# Patient Record
Sex: Female | Born: 1990 | Race: White | Hispanic: No | Marital: Married | Smoking: Never smoker
Health system: Southern US, Community
[De-identification: ages and names within clinical notes are randomized; demographics above are authoritative.]

## PROBLEM LIST (undated history)

## (undated) DIAGNOSIS — E282 Polycystic ovarian syndrome: Secondary | ICD-10-CM

## (undated) DIAGNOSIS — T8859XA Other complications of anesthesia, initial encounter: Secondary | ICD-10-CM

## (undated) DIAGNOSIS — T4145XA Adverse effect of unspecified anesthetic, initial encounter: Secondary | ICD-10-CM

## (undated) HISTORY — PX: SALPINGECTOMY: SHX328

## (undated) HISTORY — PX: ECTOPIC PREGNANCY SURGERY: SHX613

---
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
## 2017-12-11 LAB — OB RESULTS CONSOLE ANTIBODY SCREEN: Antibody Screen: NEGATIVE

## 2017-12-11 LAB — OB RESULTS CONSOLE GC/CHLAMYDIA
Chlamydia: NEGATIVE
Gonorrhea: NEGATIVE

## 2017-12-11 LAB — OB RESULTS CONSOLE HEPATITIS B SURFACE ANTIGEN: Hepatitis B Surface Ag: NEGATIVE

## 2017-12-11 LAB — OB RESULTS CONSOLE HIV ANTIBODY (ROUTINE TESTING): HIV: NONREACTIVE

## 2017-12-11 LAB — OB RESULTS CONSOLE ABO/RH: RH Type: POSITIVE

## 2017-12-11 LAB — OB RESULTS CONSOLE RPR: RPR: NONREACTIVE

## 2017-12-11 LAB — OB RESULTS CONSOLE RUBELLA ANTIBODY, IGM: Rubella: IMMUNE

## 2018-06-02 ENCOUNTER — Inpatient Hospital Stay (HOSPITAL_COMMUNITY)
Admission: AD | Admit: 2018-06-02 | Discharge: 2018-06-02 | Disposition: A | Discharge status: Home / Self Care | Payer: Medicaid Other | Source: Ambulatory Visit | Attending: Obstetrics and Gynecology | Admitting: Obstetrics and Gynecology

## 2018-06-02 ENCOUNTER — Other Ambulatory Visit: Payer: Self-pay

## 2018-06-02 ENCOUNTER — Encounter (HOSPITAL_COMMUNITY): Payer: Self-pay

## 2018-06-02 DIAGNOSIS — O98812 Other maternal infectious and parasitic diseases complicating pregnancy, second trimester: Secondary | ICD-10-CM

## 2018-06-02 DIAGNOSIS — Z3A34 34 weeks gestation of pregnancy: Secondary | ICD-10-CM | POA: Diagnosis not present

## 2018-06-02 DIAGNOSIS — B9689 Other specified bacterial agents as the cause of diseases classified elsewhere: Secondary | ICD-10-CM | POA: Diagnosis not present

## 2018-06-02 DIAGNOSIS — O23593 Infection of other part of genital tract in pregnancy, third trimester: Secondary | ICD-10-CM | POA: Insufficient documentation

## 2018-06-02 DIAGNOSIS — N760 Acute vaginitis: Secondary | ICD-10-CM | POA: Diagnosis not present

## 2018-06-02 DIAGNOSIS — Z880 Allergy status to penicillin: Secondary | ICD-10-CM | POA: Diagnosis not present

## 2018-06-02 DIAGNOSIS — O26893 Other specified pregnancy related conditions, third trimester: Secondary | ICD-10-CM | POA: Diagnosis present

## 2018-06-02 DIAGNOSIS — N898 Other specified noninflammatory disorders of vagina: Secondary | ICD-10-CM | POA: Diagnosis present

## 2018-06-02 LAB — URINALYSIS, ROUTINE W REFLEX MICROSCOPIC
Bilirubin Urine: NEGATIVE
Glucose, UA: NEGATIVE mg/dL
Ketones, ur: NEGATIVE mg/dL
Leukocytes,Ua: NEGATIVE
Nitrite: NEGATIVE
Protein, ur: NEGATIVE mg/dL
Specific Gravity, Urine: 1.015 (ref 1.005–1.030)
pH: 6 (ref 5.0–8.0)

## 2018-06-02 LAB — WET PREP, GENITAL
Sperm: NONE SEEN
Trich, Wet Prep: NONE SEEN
Yeast Wet Prep HPF POC: NONE SEEN

## 2018-06-02 MED ORDER — METRONIDAZOLE 0.75 % VA GEL: 1.0000 | Freq: Every day | VAGINAL | 0 refills | Status: AC

## 2018-06-02 NOTE — MAU Provider Note (Signed)
History     CSN: [id number]  Arrival date and time: 06/02/18 1239   First Provider Initiated Contact with Patient 06/02/18 1339      Chief Complaint  Patient presents with  . Pelvic Pain  . Rupture of Membranes   HPI  Ms.  Diamond Cunningham is a 28 y.o. year old G26P2052 female at [redacted]w[redacted]d weeks gestation who presents to MAU reporting "leaking a clear fluid since 2100 last night (5/16)" and lower pelvic pain (rated 4/100; which is not a new issue. She denies recent SI. She describes the leaking as not trickling, but an increased amount that causes her underwear to be wet. She expresses concern that the increased discharge means something could be wrong. She states she is not supposed to labor, because she is having a RCS. She called her OB office and was instructed to come to MAU for evaluation. She receives her prenatal care at [facility]  History reviewed. No pertinent past medical history.  Past Surgical History:  Procedure Laterality Date  . ECTOPIC PREGNANCY SURGERY     states the pregnancy was partially in uterus and in tube so she had an incision on uterus, with wedge resection  . SALPINGECTOMY Left     History reviewed. No pertinent family history.  Social History   Tobacco Use  . Smoking status: Never Smoker  . Smokeless tobacco: Never Used  Substance Use Topics  . Alcohol use: Not Currently  . Drug use: Never    Allergies:  Allergies  Allergen Reactions  . Shellfish Allergy Shortness Of Breath and Nausea Only  . Penicillins Rash    No medications prior to admission.    Review of Systems  Constitutional: Negative.   HENT: Negative.   Eyes: Negative.   Respiratory: Negative.   Cardiovascular: Negative.   Gastrointestinal: Negative.   Endocrine: Negative.   Genitourinary: Positive for pelvic pain ("not a new problem, has prevented SI for a while") and vaginal discharge (clear fluid that wets underwear since last night at about 2100).   Musculoskeletal: Negative.   Skin: Negative.   Allergic/Immunologic: Negative.   Neurological: Negative.   Hematological: Negative.   Psychiatric/Behavioral: Negative.    Physical Exam   Blood pressure (!) 130/56, pulse (!) 113, resp. rate 20, height 5\' 4"  (1.626 m), weight 93.5 kg, SpO2 97 %.  Physical Exam  Nursing note and vitals reviewed. Constitutional: She is oriented to person, place, and time. She appears well-developed and well-nourished.  HENT:  Head: Normocephalic and atraumatic.  Eyes: Pupils are equal, round, and reactive to light.  Neck: Normal range of motion.  Cardiovascular: Normal rate, regular rhythm, normal heart sounds and intact distal pulses.  Respiratory: Effort normal and breath sounds normal.  GI: Soft. Bowel sounds are normal.  Genitourinary:    Genitourinary Comments: Uterus: gravid, S=D, SE: cervix is smooth, pink, no lesions, moderate amt of thin, white vaginal d/c -- WP, GC/CT done, closed/long/soft, no CMT or friability, no adnexal tenderness    Musculoskeletal: Normal range of motion.  Neurological: She is alert and oriented to person, place, and time. She has normal reflexes.  Skin: Skin is warm and dry.  Psychiatric: She has a normal mood and affect. Her behavior is normal. Judgment and thought content normal.   NST - FHR: 140 bpm / moderate variability / accels present / decels absent / TOCO: Occ. UI noted  MAU Course  Procedures  MDM CCUA Wet Prep GC/CT -- pending Fern Test NST  Results for  orders placed or performed during the hospital encounter of 06/02/18 (from the past 24 hour(s))  Urinalysis, Routine w reflex microscopic     Status: Abnormal   Collection Time: 06/02/18  1:57 PM  Result Value Ref Range   Color, Urine YELLOW YELLOW   APPearance CLEAR CLEAR   Specific Gravity, Urine 1.015 1.005 - 1.030   pH 6.0 5.0 - 8.0   Glucose, UA NEGATIVE NEGATIVE mg/dL   Hgb urine dipstick SMALL (A) NEGATIVE   Bilirubin Urine NEGATIVE  NEGATIVE   Ketones, ur NEGATIVE NEGATIVE mg/dL   Protein, ur NEGATIVE NEGATIVE mg/dL   Nitrite NEGATIVE NEGATIVE   Leukocytes,Ua NEGATIVE NEGATIVE   RBC / HPF 11-20 0 - 5 RBC/hpf   WBC, UA 0-5 0 - 5 WBC/hpf   Bacteria, UA RARE (A) NONE SEEN   Squamous Epithelial / LPF 0-5 0 - 5   Mucus PRESENT   Wet prep, genital     Status: Abnormal   Collection Time: 06/02/18  1:57 PM  Result Value Ref Range   Yeast Wet Prep HPF POC NONE SEEN NONE SEEN   Trich, Wet Prep NONE SEEN NONE SEEN   Clue Cells Wet Prep HPF POC PRESENT (A) NONE SEEN   WBC, Wet Prep HPF POC MODERATE (A) NONE SEEN   Sperm NONE SEEN      Assessment and Plan  Bacterial vaginosis - Plan: Discharge patient - Information provided on BV and Metrogel - Keep scheduled appt with [facility] - Patient verbalized an understanding of the plan of care and agrees.    Allergies as of 06/02/2018      Reactions   Shellfish Allergy Shortness Of Breath, Nausea Only   Penicillins Rash      Medication List    TAKE these medications   metroNIDAZOLE 0.75 % vaginal gel Commonly known as:  METROGEL VAGINAL Place 1 Applicatorful vaginally at bedtime for 5 days.       Raelyn Moraolitta Jaspreet Hollings, MSN, CNM 06/02/2018, 1:39 PM

## 2018-06-02 NOTE — MAU Note (Signed)
Pt reports leaking some clear fluid since 9pm last night.  Pt denies any recent intercourse.  Pt also c/o pelvic pain that she rates 4/10.  Pt reports good fetal movement.

## 2018-06-03 LAB — GC/CHLAMYDIA PROBE AMP (~~LOC~~) NOT AT ARMC
Chlamydia: NEGATIVE
Neisseria Gonorrhea: NEGATIVE

## 2018-06-14 ENCOUNTER — Encounter (HOSPITAL_COMMUNITY): Payer: Self-pay | Admitting: *Deleted

## 2018-06-14 NOTE — Patient Instructions (Addendum)
Asheli Matyas  06/14/2018   Your procedure is scheduled on:  6.15.2020  Arrive at 0730 at Entrance C on [address] at [facility]  and [employer]. You are invited to use the FREE valet parking or use the Visitor's parking deck.  Pick up the phone at the desk and dial [phone].  Call this number if you have problems the morning of surgery: [phone]  Remember:   Do not eat food:(After Midnight) Desps de medianoche.  Do not drink clear liquids: (After Midnight) Desps de medianoche.  Take these medicines the morning of surgery with A SIP OF WATER:  No metformin the night before surgery   Do not wear jewelry, make-up or nail polish.  Do not wear lotions, powders, or perfumes. Do not wear deodorant.  Do not shave 48 hours prior to surgery.  Do not bring valuables to the hospital.  [facility] is not   responsible for any belongings or valuables brought to the hospital.  Contacts, dentures or bridgework may not be worn into surgery.  Leave suitcase in the car. After surgery it may be brought to your room.  For patients admitted to the hospital, checkout time is 11:00 AM the day of              discharge.      Please read over the following fact sheets that you were given:     Preparing for Surgery

## 2018-06-18 ENCOUNTER — Encounter (HOSPITAL_COMMUNITY): Payer: Self-pay | Admitting: *Deleted

## 2018-06-26 NOTE — H&P (Signed)
Diamond Cunningham is a 73 y.A.[id number] female presenting for primary cesarean section with tubal ligation due to history of cornual ectopic in 06/2017. Pt is dated per LMP which was confirmed with a 5week [place]. Pregnancy has been complicated recently with preterm contractions but no labor. 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 She is GBS negative. She declined genetic screening OB History    Gravida  8   Para  2   Term  2   Preterm      AB  5   Living  2     SAB  4   TAB      Ectopic  1   Multiple      Live Births             Past Medical History:  Diagnosis Date  . Complication of anesthesia    dental work anesthesia doesn't work  . PCOS (polycystic ovarian syndrome)    Past Surgical History:  Procedure Laterality Date  . ECTOPIC PREGNANCY SURGERY     states the pregnancy was partially in uterus and in tube so she had an incision on uterus, with wedge resection  . SALPINGECTOMY Left    Family History: family history includes Heart disease in her paternal grandfather; Hypertension in her father. Social History:  reports that she has never smoked. She has never used smokeless tobacco. She reports previous alcohol use. She reports that she does not use drugs.     Maternal Diabetes: No Genetic Screening: Declined Maternal Ultrasounds/Referrals: Normal Fetal Ultrasounds or other Referrals:  None Maternal Substance Abuse:  No Significant Maternal Medications:  Meds include: Other: metformin Significant Maternal Lab Results:  Lab values include: Group B Strep negative Other Comments:  None  Review of Systems  Constitutional: Negative for chills, diaphoresis, fever, malaise/fatigue and weight loss.  Eyes: Negative for blurred vision and double vision.  Respiratory: Negative for cough and shortness of breath.   Cardiovascular: Positive for leg  swelling. Negative for chest pain and palpitations.  Gastrointestinal: Positive for abdominal pain. Negative for heartburn, nausea and vomiting.  Genitourinary: Negative for dysuria and urgency.  Musculoskeletal: Negative for myalgias.  Skin: Negative for itching and rash.  Neurological: Negative for dizziness and headaches.  Endo/Heme/Allergies: Negative for environmental allergies. Does not bruise/bleed easily.  Psychiatric/Behavioral: Negative for depression, hallucinations, substance abuse and suicidal ideas. The patient is nervous/anxious.    Maternal Medical History:  Reason for admission: Nausea. Scheduled cesarean section and tubal ligation  Contractions: Onset was more than 2 days ago.   Frequency: irregular.   Perceived severity is mild.    Fetal activity: Perceived fetal activity is normal.   Last perceived fetal movement was within the past hour.    Prenatal complications: no prenatal complications Prenatal Complications - Diabetes: none.      Height 5\' 4"  (1.626 m), weight 93 kg. Maternal Exam:  Uterine Assessment: Contraction strength is mild.  Contraction frequency is irregular.   Abdomen: Patient reports generalized tenderness.  Estimated fetal weight is AGA.   Fetal presentation: vertex  Introitus: Vulva is negative for lesion.  Pelvis: of concern for delivery.      Physical Exam  Constitutional: She is oriented to person, place, and time. She appears well-developed and well-nourished.  Neck: Normal range of motion.  Respiratory: Effort normal.  GI: Soft. There is generalized abdominal tenderness.  Genitourinary:  No vulval lesion noted.   Musculoskeletal: Normal range of motion.  Neurological: She is alert and oriented to person, place, and time.  Skin: Skin is warm.  Psychiatric: She has a normal mood and affect. Her behavior is normal. Judgment and thought content normal.    Prenatal labs: ABO, Rh: B/Positive/-- (11/26 0000) Antibody:  Negative (11/26 0000) Rubella: Immune (11/26 0000) RPR: Nonreactive (11/26 0000)  HBsAg: Negative (11/26 0000)  HIV: Non-reactive (11/26 0000)  GBS:     Assessment/Plan: 27y G8 [id number] female presenting at 5838 2/7weeks for primary cesarean section with tubal ligation due to history of cornual ectopic  Risks and benefits of procedure have been reviewed GBS neg  Pt to OR when ready SCDs and antibx ordered ERAS protocol Covid testing done  Diamond Cunningham 06/26/2018, 3:35 PM

## 2018-06-27 ENCOUNTER — Other Ambulatory Visit: Payer: Self-pay

## 2018-06-27 ENCOUNTER — Ambulatory Visit (HOSPITAL_COMMUNITY)
Admission: RE | Admit: 2018-06-27 | Discharge: 2018-06-27 | Disposition: A | Discharge status: Home / Self Care | Payer: Medicaid Other | Source: Ambulatory Visit | Attending: Obstetrics and Gynecology | Admitting: Obstetrics and Gynecology

## 2018-06-27 DIAGNOSIS — Z1159 Encounter for screening for other viral diseases: Secondary | ICD-10-CM | POA: Diagnosis not present

## 2018-06-27 NOTE — MAU Note (Signed)
Asymptomatic, unable to pass on left, no problems on rt

## 2018-06-28 LAB — NOVEL CORONAVIRUS, NAA (HOSP ORDER, SEND-OUT TO REF LAB; TAT 18-24 HRS): SARS-CoV-2, NAA: NOT DETECTED

## 2018-06-30 NOTE — Anesthesia Preprocedure Evaluation (Addendum)
Anesthesia Evaluation  Patient identified by MRN, date of birth, ID band Patient awake  General Assessment Comment:Anesthesia in dental office did not work  Reviewed: Allergy & Precautions, NPO status , Patient's Chart, lab work & pertinent test results  Airway Mallampati: II  TM Distance: >3 FB Neck ROM: Full    Dental no notable dental hx. (+) Teeth Intact   Pulmonary neg pulmonary ROS,    Pulmonary exam normal breath sounds clear to auscultation       Cardiovascular Normal cardiovascular exam Rhythm:Regular Rate:Normal     Neuro/Psych    GI/Hepatic Neg liver ROS,   Endo/Other  negative endocrine ROS  Renal/GU      Musculoskeletal   Abdominal   Peds  Hematology   Anesthesia Other Findings   Reproductive/Obstetrics (+) Pregnancy                            Lab Results  Component Value Date   WBC 9.2 07/01/2018   HGB 10.6 (L) 07/01/2018   HCT 30.9 (L) 07/01/2018   MCV 91.2 07/01/2018   PLT 206 07/01/2018    Anesthesia Physical Anesthesia Plan  ASA: III  Anesthesia Plan: Spinal   Post-op Pain Management:    Induction:   PONV Risk Score and Plan: 3 and Treatment may vary due to age or medical condition, Dexamethasone and Ondansetron  Airway Management Planned: Natural Airway and Simple Face Mask  Additional Equipment:   Intra-op Plan:   Post-operative Plan:   Informed Consent: I have reviewed the patients History and Physical, chart, labs and discussed the procedure including the risks, benefits and alternatives for the proposed anesthesia with the patient or authorized representative who has indicated his/her understanding and acceptance.     Dental advisory given  Plan Discussed with:   Anesthesia Plan Comments: (Primary c/s w PPTL under spinal pend labs, T&S, )       Anesthesia Quick Evaluation

## 2018-07-01 ENCOUNTER — Encounter (HOSPITAL_COMMUNITY): Payer: Self-pay | Admitting: *Deleted

## 2018-07-01 ENCOUNTER — Inpatient Hospital Stay (HOSPITAL_COMMUNITY): Payer: Medicaid Other | Admitting: Anesthesiology

## 2018-07-01 ENCOUNTER — Encounter (HOSPITAL_COMMUNITY)
Admission: AD | Disposition: A | Discharge status: Home / Self Care | Payer: Self-pay | Source: Home / Self Care | Attending: Obstetrics and Gynecology

## 2018-07-01 ENCOUNTER — Inpatient Hospital Stay (HOSPITAL_COMMUNITY)
Admission: AD | Admit: 2018-07-01 | Discharge: 2018-07-03 | DRG: 785 | Disposition: A | Discharge status: Home / Self Care | Payer: Medicaid Other | Attending: Obstetrics and Gynecology | Admitting: Obstetrics and Gynecology

## 2018-07-01 ENCOUNTER — Other Ambulatory Visit: Payer: Self-pay

## 2018-07-01 DIAGNOSIS — Z3A38 38 weeks gestation of pregnancy: Secondary | ICD-10-CM

## 2018-07-01 DIAGNOSIS — O99284 Endocrine, nutritional and metabolic diseases complicating childbirth: Principal | ICD-10-CM | POA: Diagnosis present

## 2018-07-01 DIAGNOSIS — Z9851 Tubal ligation status: Secondary | ICD-10-CM

## 2018-07-01 DIAGNOSIS — Z98891 History of uterine scar from previous surgery: Secondary | ICD-10-CM

## 2018-07-01 DIAGNOSIS — Z3490 Encounter for supervision of normal pregnancy, unspecified, unspecified trimester: Secondary | ICD-10-CM

## 2018-07-01 DIAGNOSIS — Z302 Encounter for sterilization: Secondary | ICD-10-CM | POA: Diagnosis not present

## 2018-07-01 DIAGNOSIS — O26893 Other specified pregnancy related conditions, third trimester: Secondary | ICD-10-CM | POA: Diagnosis present

## 2018-07-01 DIAGNOSIS — E282 Polycystic ovarian syndrome: Secondary | ICD-10-CM | POA: Diagnosis present

## 2018-07-01 HISTORY — DX: Polycystic ovarian syndrome: E28.2

## 2018-07-01 HISTORY — DX: Other complications of anesthesia, initial encounter: T88.59XA

## 2018-07-01 HISTORY — DX: Tubal ligation status: Z98.51

## 2018-07-01 LAB — TYPE AND SCREEN
ABO/RH(D): B POS
Antibody Screen: NEGATIVE

## 2018-07-01 LAB — CBC
HCT: 30.9 % — ABNORMAL LOW (ref 36.0–46.0)
Hemoglobin: 10.6 g/dL — ABNORMAL LOW (ref 12.0–15.0)
MCH: 31.3 pg (ref 26.0–34.0)
MCHC: 34.3 g/dL (ref 30.0–36.0)
MCV: 91.2 fL (ref 80.0–100.0)
Platelets: 206 10*3/uL (ref 150–400)
RBC: 3.39 MIL/uL — ABNORMAL LOW (ref 3.87–5.11)
RDW: 14.5 % (ref 11.5–15.5)
WBC: 9.2 10*3/uL (ref 4.0–10.5)
nRBC: 0 % (ref 0.0–0.2)

## 2018-07-01 LAB — ABO/RH
ABO/RH(D): B POS
Weak D: POSITIVE

## 2018-07-01 SURGERY — Surgical Case
Anesthesia: Spinal

## 2018-07-01 MED ORDER — NALOXONE HCL 0.4 MG/ML IJ SOLN: 0.4000 mg | INTRAMUSCULAR | Status: DC | PRN

## 2018-07-01 MED ORDER — DEXAMETHASONE SODIUM PHOSPHATE 4 MG/ML IJ SOLN
INTRAMUSCULAR | Status: DC | PRN
  Administered 2018-07-01: 4 mg via INTRAVENOUS

## 2018-07-01 MED ORDER — BUPIVACAINE IN DEXTROSE 0.75-8.25 % IT SOLN
INTRATHECAL | Status: DC | PRN
  Administered 2018-07-01: 12 mg via INTRATHECAL

## 2018-07-01 MED ORDER — HYDROMORPHONE HCL 1 MG/ML IJ SOLN: 0.2500 mg | INTRAMUSCULAR | Status: DC | PRN

## 2018-07-01 MED ORDER — LACTATED RINGERS IV SOLN
INTRAVENOUS | Status: DC
  Administered 2018-07-01 (×2): via INTRAVENOUS

## 2018-07-01 MED ORDER — GENTAMICIN SULFATE 40 MG/ML IJ SOLN
5.0000 mg/kg | INTRAVENOUS | Status: AC
  Administered 2018-07-01: 465.2 mg via INTRAVENOUS
  Filled 2018-07-01: qty 11.75

## 2018-07-01 MED ORDER — CLINDAMYCIN PHOSPHATE 900 MG/50ML IV SOLN
INTRAVENOUS | Status: AC
  Filled 2018-07-01: qty 50

## 2018-07-01 MED ORDER — ONDANSETRON HCL 4 MG/2ML IJ SOLN: 4.0000 mg | Freq: Once | INTRAMUSCULAR | Status: DC | PRN

## 2018-07-01 MED ORDER — NALBUPHINE HCL 10 MG/ML IJ SOLN: 5.0000 mg | INTRAMUSCULAR | Status: DC | PRN

## 2018-07-01 MED ORDER — DIPHENHYDRAMINE HCL 25 MG PO CAPS: 25.0000 mg | ORAL_CAPSULE | ORAL | Status: DC | PRN

## 2018-07-01 MED ORDER — DEXMEDETOMIDINE HCL IN NACL 200 MCG/50ML IV SOLN
INTRAVENOUS | Status: AC
  Filled 2018-07-01: qty 50

## 2018-07-01 MED ORDER — IBUPROFEN 800 MG PO TABS
800.0000 mg | ORAL_TABLET | Freq: Three times a day (TID) | ORAL | Status: DC
  Administered 2018-07-01 – 2018-07-03 (×6): 800 mg via ORAL
  Filled 2018-07-01 (×7): qty 1

## 2018-07-01 MED ORDER — MEPERIDINE HCL 25 MG/ML IJ SOLN: 6.2500 mg | INTRAMUSCULAR | Status: DC | PRN

## 2018-07-01 MED ORDER — KETOROLAC TROMETHAMINE 30 MG/ML IJ SOLN
30.0000 mg | Freq: Four times a day (QID) | INTRAMUSCULAR | Status: AC | PRN
  Administered 2018-07-01: 30 mg via INTRAMUSCULAR

## 2018-07-01 MED ORDER — WITCH HAZEL-GLYCERIN EX PADS: 1.0000 "application " | MEDICATED_PAD | CUTANEOUS | Status: DC | PRN

## 2018-07-01 MED ORDER — SIMETHICONE 80 MG PO CHEW
80.0000 mg | CHEWABLE_TABLET | ORAL | Status: DC
  Administered 2018-07-01 – 2018-07-02 (×2): 80 mg via ORAL
  Filled 2018-07-01 (×2): qty 1

## 2018-07-01 MED ORDER — SODIUM CHLORIDE 0.9 % IV SOLN
INTRAVENOUS | Status: DC | PRN
  Administered 2018-07-01: 10:00:00 via INTRAVENOUS

## 2018-07-01 MED ORDER — CLINDAMYCIN PHOSPHATE 900 MG/50ML IV SOLN
900.0000 mg | INTRAVENOUS | Status: AC
  Administered 2018-07-01: 900 mg via INTRAVENOUS

## 2018-07-01 MED ORDER — FENTANYL CITRATE (PF) 100 MCG/2ML IJ SOLN
INTRAMUSCULAR | Status: AC
  Filled 2018-07-01: qty 2

## 2018-07-01 MED ORDER — FENTANYL CITRATE (PF) 100 MCG/2ML IJ SOLN
INTRAMUSCULAR | Status: DC | PRN
  Administered 2018-07-01: 15 ug via INTRATHECAL

## 2018-07-01 MED ORDER — SENNOSIDES-DOCUSATE SODIUM 8.6-50 MG PO TABS
2.0000 | ORAL_TABLET | ORAL | Status: DC
  Administered 2018-07-01 – 2018-07-02 (×2): 2 via ORAL
  Filled 2018-07-01 (×2): qty 2

## 2018-07-01 MED ORDER — ACETAMINOPHEN 500 MG PO TABS
1000.0000 mg | ORAL_TABLET | ORAL | Status: AC
  Administered 2018-07-01: 09:00:00 1000 mg via ORAL

## 2018-07-01 MED ORDER — STERILE WATER FOR IRRIGATION IR SOLN
Status: DC | PRN
  Administered 2018-07-01: 1000 mL

## 2018-07-01 MED ORDER — NALOXONE HCL 4 MG/10ML IJ SOLN
1.0000 ug/kg/h | INTRAVENOUS | Status: DC | PRN
  Filled 2018-07-01: qty 5

## 2018-07-01 MED ORDER — KETOROLAC TROMETHAMINE 30 MG/ML IJ SOLN: 30.0000 mg | Freq: Once | INTRAMUSCULAR | Status: DC | PRN

## 2018-07-01 MED ORDER — NALBUPHINE HCL 10 MG/ML IJ SOLN: 5.0000 mg | Freq: Once | INTRAMUSCULAR | Status: DC | PRN

## 2018-07-01 MED ORDER — DIPHENHYDRAMINE HCL 50 MG/ML IJ SOLN: 12.5000 mg | INTRAMUSCULAR | Status: DC | PRN

## 2018-07-01 MED ORDER — KETOROLAC TROMETHAMINE 30 MG/ML IJ SOLN
30.0000 mg | Freq: Four times a day (QID) | INTRAMUSCULAR | Status: AC | PRN
  Filled 2018-07-01: qty 1

## 2018-07-01 MED ORDER — ONDANSETRON HCL 4 MG/2ML IJ SOLN
INTRAMUSCULAR | Status: AC
  Filled 2018-07-01: qty 2

## 2018-07-01 MED ORDER — MORPHINE SULFATE (PF) 0.5 MG/ML IJ SOLN
INTRAMUSCULAR | Status: AC
  Filled 2018-07-01: qty 10

## 2018-07-01 MED ORDER — SCOPOLAMINE 1 MG/3DAYS TD PT72
MEDICATED_PATCH | TRANSDERMAL | Status: AC
  Filled 2018-07-01: qty 1

## 2018-07-01 MED ORDER — DIBUCAINE (PERIANAL) 1 % EX OINT: 1.0000 "application " | TOPICAL_OINTMENT | CUTANEOUS | Status: DC | PRN

## 2018-07-01 MED ORDER — ONDANSETRON HCL 4 MG/2ML IJ SOLN: 4.0000 mg | Freq: Three times a day (TID) | INTRAMUSCULAR | Status: DC | PRN

## 2018-07-01 MED ORDER — OXYCODONE HCL 5 MG PO TABS
5.0000 mg | ORAL_TABLET | ORAL | Status: DC | PRN
  Administered 2018-07-02: 5 mg via ORAL
  Administered 2018-07-02: 10 mg via ORAL
  Administered 2018-07-02 (×3): 5 mg via ORAL
  Administered 2018-07-02: 10 mg via ORAL
  Administered 2018-07-03 (×2): 5 mg via ORAL
  Filled 2018-07-01 (×6): qty 1
  Filled 2018-07-01 (×2): qty 2

## 2018-07-01 MED ORDER — SODIUM CHLORIDE 0.9 % IR SOLN
Status: DC | PRN
  Administered 2018-07-01: 1

## 2018-07-01 MED ORDER — PHENYLEPHRINE HCL-NACL 20-0.9 MG/250ML-% IV SOLN
INTRAVENOUS | Status: DC | PRN
  Administered 2018-07-01: 60 ug/min via INTRAVENOUS

## 2018-07-01 MED ORDER — LACTATED RINGERS IV SOLN
INTRAVENOUS | Status: DC
  Administered 2018-07-01 (×3): via INTRAVENOUS

## 2018-07-01 MED ORDER — ACETAMINOPHEN 500 MG PO TABS
ORAL_TABLET | ORAL | Status: AC
  Filled 2018-07-01: qty 2

## 2018-07-01 MED ORDER — PHENYLEPHRINE HCL-NACL 20-0.9 MG/250ML-% IV SOLN
INTRAVENOUS | Status: AC
  Filled 2018-07-01: qty 250

## 2018-07-01 MED ORDER — SODIUM CHLORIDE 0.9% FLUSH: 3.0000 mL | INTRAVENOUS | Status: DC | PRN

## 2018-07-01 MED ORDER — TETANUS-DIPHTH-ACELL PERTUSSIS 5-2.5-18.5 LF-MCG/0.5 IM SUSP: 0.5000 mL | Freq: Once | INTRAMUSCULAR | Status: DC

## 2018-07-01 MED ORDER — DIPHENHYDRAMINE HCL 25 MG PO CAPS: 25.0000 mg | ORAL_CAPSULE | Freq: Four times a day (QID) | ORAL | Status: DC | PRN

## 2018-07-01 MED ORDER — SIMETHICONE 80 MG PO CHEW: 80.0000 mg | CHEWABLE_TABLET | ORAL | Status: DC | PRN

## 2018-07-01 MED ORDER — PRENATAL MULTIVITAMIN CH
1.0000 | ORAL_TABLET | Freq: Every day | ORAL | Status: DC
  Administered 2018-07-02 – 2018-07-03 (×2): 1 via ORAL
  Filled 2018-07-01 (×2): qty 1

## 2018-07-01 MED ORDER — SIMETHICONE 80 MG PO CHEW
80.0000 mg | CHEWABLE_TABLET | Freq: Three times a day (TID) | ORAL | Status: DC
  Administered 2018-07-01 – 2018-07-03 (×7): 80 mg via ORAL
  Filled 2018-07-01 (×7): qty 1

## 2018-07-01 MED ORDER — OXYTOCIN 40 UNITS IN NORMAL SALINE INFUSION - SIMPLE MED: 2.5000 [IU]/h | INTRAVENOUS | Status: AC

## 2018-07-01 MED ORDER — HYDROCODONE-ACETAMINOPHEN 7.5-325 MG PO TABS: 1.0000 | ORAL_TABLET | Freq: Once | ORAL | Status: DC | PRN

## 2018-07-01 MED ORDER — OXYTOCIN 40 UNITS IN NORMAL SALINE INFUSION - SIMPLE MED
INTRAVENOUS | Status: AC
  Filled 2018-07-01: qty 1000

## 2018-07-01 MED ORDER — SCOPOLAMINE 1 MG/3DAYS TD PT72
1.0000 | MEDICATED_PATCH | Freq: Once | TRANSDERMAL | Status: DC
  Administered 2018-07-01: 1.5 mg via TRANSDERMAL

## 2018-07-01 MED ORDER — ONDANSETRON HCL 4 MG/2ML IJ SOLN
INTRAMUSCULAR | Status: DC | PRN
  Administered 2018-07-01: 4 mg via INTRAVENOUS

## 2018-07-01 MED ORDER — OXYTOCIN 10 UNIT/ML IJ SOLN
INTRAMUSCULAR | Status: AC
  Filled 2018-07-01: qty 4

## 2018-07-01 MED ORDER — MENTHOL 3 MG MT LOZG: 1.0000 | LOZENGE | OROMUCOSAL | Status: DC | PRN

## 2018-07-01 MED ORDER — ZOLPIDEM TARTRATE 5 MG PO TABS: 5.0000 mg | ORAL_TABLET | Freq: Every evening | ORAL | Status: DC | PRN

## 2018-07-01 MED ORDER — MORPHINE SULFATE (PF) 0.5 MG/ML IJ SOLN
INTRAMUSCULAR | Status: DC | PRN
  Administered 2018-07-01: .1 mg via INTRATHECAL

## 2018-07-01 MED ORDER — COCONUT OIL OIL: 1.0000 "application " | TOPICAL_OIL | Status: DC | PRN

## 2018-07-01 MED ORDER — KETOROLAC TROMETHAMINE 30 MG/ML IJ SOLN
INTRAMUSCULAR | Status: AC
  Filled 2018-07-01: qty 1

## 2018-07-01 MED ORDER — SODIUM CHLORIDE 0.9 % IV SOLN
INTRAVENOUS | Status: DC | PRN
  Administered 2018-07-01: 40 [IU] via INTRAVENOUS

## 2018-07-01 SURGICAL SUPPLY — 39 items
BENZOIN TINCTURE PRP APPL 2/3 (GAUZE/BANDAGES/DRESSINGS) ×3 IMPLANT
CHLORAPREP W/TINT 26ML (MISCELLANEOUS) ×3 IMPLANT
CLAMP CORD UMBIL (MISCELLANEOUS) IMPLANT
CLOSURE WOUND 1/2 X4 (GAUZE/BANDAGES/DRESSINGS) ×1
CLOTH BEACON ORANGE TIMEOUT ST (SAFETY) ×3 IMPLANT
DRAPE C SECTION CLR SCREEN (DRAPES) ×3 IMPLANT
DRSG OPSITE POSTOP 4X10 (GAUZE/BANDAGES/DRESSINGS) ×3 IMPLANT
ELECT REM PT RETURN 9FT ADLT (ELECTROSURGICAL) ×3
ELECTRODE REM PT RTRN 9FT ADLT (ELECTROSURGICAL) ×1 IMPLANT
EXTRACTOR VACUUM KIWI (MISCELLANEOUS) IMPLANT
GAUZE SPONGE 4X4 12PLY STRL LF (GAUZE/BANDAGES/DRESSINGS) ×6 IMPLANT
GLOVE BIO SURGEON STRL SZ 6.5 (GLOVE) ×4 IMPLANT
GLOVE BIO SURGEONS STRL SZ 6.5 (GLOVE) ×2
GLOVE BIOGEL PI IND STRL 7.0 (GLOVE) ×2 IMPLANT
GLOVE BIOGEL PI INDICATOR 7.0 (GLOVE) ×4
GOWN STRL REUS W/TWL LRG LVL3 (GOWN DISPOSABLE) ×9 IMPLANT
KIT ABG SYR 3ML LUER SLIP (SYRINGE) IMPLANT
NEEDLE HYPO 25X5/8 SAFETYGLIDE (NEEDLE) IMPLANT
NS IRRIG 1000ML POUR BTL (IV SOLUTION) ×3 IMPLANT
PACK C SECTION WH (CUSTOM PROCEDURE TRAY) ×3 IMPLANT
PAD ABD 7.5X8 STRL (GAUZE/BANDAGES/DRESSINGS) ×3 IMPLANT
PAD OB MATERNITY 4.3X12.25 (PERSONAL CARE ITEMS) ×3 IMPLANT
RETRACTOR WND ALEXIS 25 LRG (MISCELLANEOUS) ×1 IMPLANT
RTRCTR C-SECT PINK 25CM LRG (MISCELLANEOUS) IMPLANT
RTRCTR WOUND ALEXIS 25CM LRG (MISCELLANEOUS) ×3
STRIP CLOSURE SKIN 1/2X4 (GAUZE/BANDAGES/DRESSINGS) ×2 IMPLANT
SUT CHROMIC 1 CTX 36 (SUTURE) ×6 IMPLANT
SUT PLAIN 0 NONE (SUTURE) IMPLANT
SUT PLAIN 2 0 XLH (SUTURE) ×3 IMPLANT
SUT VIC AB 0 CT1 27 (SUTURE) ×4
SUT VIC AB 0 CT1 27XBRD ANBCTR (SUTURE) ×2 IMPLANT
SUT VIC AB 2-0 CT1 27 (SUTURE) ×2
SUT VIC AB 2-0 CT1 TAPERPNT 27 (SUTURE) ×1 IMPLANT
SUT VIC AB 3-0 CT1 27 (SUTURE)
SUT VIC AB 3-0 CT1 TAPERPNT 27 (SUTURE) IMPLANT
SUT VIC AB 4-0 KS 27 (SUTURE) ×3 IMPLANT
TOWEL OR 17X24 6PK STRL BLUE (TOWEL DISPOSABLE) ×3 IMPLANT
TRAY FOLEY W/BAG SLVR 14FR LF (SET/KITS/TRAYS/PACK) ×3 IMPLANT
WATER STERILE IRR 1000ML POUR (IV SOLUTION) ×3 IMPLANT

## 2018-07-01 NOTE — Op Note (Signed)
Operative Note    Preoperative Diagnosis 1. IUP at 39 + weeks  2. Previous uterine surgery ( cornual ectopic) 3. Desires sterilization of residual tube   Postoperative Diagnosis Same   Procedure: Primary cesarean section with right salpingectomy   Surgeon: Mickle Mallory DO Assist: Claretta Fraise RNFA  Anesthesia: Spinal  Fluids: LR 2272ml EBL: 350ml UOP: 110ml   Findings: Viable female infant in vertex position, grossly nl ovaries bilaterally; Apgars 9,9; weight pending; absent left tube, nl right tube. Stable scar in left cornua   Specimen: Right fallopian tube and placenta   Procedure Note Consent verified pre-op. All questions answered   Patient was taken to the operating room where spinal anesthesia was administered. She was prepped and draped in the normal sterile fashion after placed in the dorsal supine position with a leftward tilt. An appropriate time out was performed. A Pfannenstiel skin incision was then made 2 finger breaths above the pubis symphysis and carried through to the underlying layer of fascia by sharp dissection and Bovie cautery. The fascia was nicked in the midline and the incision was extended laterally with Mayo scissors. The superior and inferior aspects of the incision were grasped Coker clamps and dissected off the underlying rectus muscles.Rectus muscles were separated in the midline  and the peritoneal cavity entered bluntly. The peritoneal incision was then extended both superiorly and inferiorly with careful attention to avoid both bowel and bladder. The Alexis self-retaining wound retractor was then placed and the lower uterine segment exposed. The bladder flap was developed with Metzenbaum scissors and pushed away from the lower uterine segment. The lower uterine segment was then incised in a transverse fashion and the cavity entered.The infant's head was then lifted and delivered from the incision without difficulty. Vigorous spontaneous cry  was noted during delivery.  The remainder of the infant delivered and the nose and mouth bulb suctioned. The cord was clamped and cut after a minute delay. The infant was handed off to the waiting pediatricians. The placenta was then spontaneously expressed from the uterus and the uterus cleared of all clots and debris with moist lap sponge. The uterine incision was then repaired in 2 layers the first layer was a running locked layer 1-0 chromic and the second an imbricating layer of the same suture. The tubes and ovaries were inspected and the gutters cleared of all clots and debris. The left cornua was noted to have a stable scar and absent fallopian tube. The right fallopian tube was grasped with a kocher clamp, suture ligated x 2 and cut. Hemostasis was noted. Next the uterine incision was re- inspected and found to be hemostatic. All instruments and sponges and the alexis were then removed from the abdomen. The peritoneum was then reapproximated in a purse string fashion with sutures of 2-0 Vicryl. The rectus muscles were reapproximated next in a loose figure 8 stitch using the same suture. The fascia was then closed with 0 Vicryl in a running fashion.  The skin was closed with a subcuticular stitch of 4-0 Vicryl on a Keith needle and then reinforced with benzoin and Steri-Strips. At the conclusion of the procedure all instruments and sponge counts were correct. Patient was taken to the recovery room in good condition with her baby accompanying her skin to skin.

## 2018-07-01 NOTE — Lactation Note (Signed)
This note was copied from a baby's chart. Lactation Consultation Note  Patient Name: Diamond Cunningham [id number]'C Date: 07/01/2018 Reason for consult: Early term 37-38.6wks;Initial assessment  P3 mother whose infant is now 33 hours old.  Mother breast fed her other two children(now 8 and 28 years old)  for 1 1/2 years each.  The second child had extensive lip, tongue and cheek ties, per mother.  The pediatrician has already assessed this baby and she does not have a tongue tie.  Mother was relieved to hear this news.  Mother stated that she feels like she may have a plugged duct in her left breast.  After baby breast fed it felt better.  I offered to initiate the DEBP and mother accepted.  Pump parts, assembly, disassembly and cleaning reviewed.  Mother's breasts are large, soft (except for the plugged area) and non tender and nipples are everted, short shafted and intact.    Encouraged to feed 8-12 times/24 hours or sooner if baby shows feeding cues.  Mother is familiar with cues and hand expression.  Colostrum container provided and milk storage times reviewed.  Finger feeding demonstrated.  Spoon left at bedside for spoon feeding if desired.    Mother will be a "stay at home" mother and has a DEBP for home use.  Mom encouraged to feed baby 8-12 times/24 hours and with feeding cues.  Virtual breast feeding support group information given.  Mother will call for latch assistance as needed.  Father present.   Maternal Data Formula Feeding for Exclusion: No Has patient been taught Hand Expression?: Yes Does the patient have breastfeeding experience prior to this delivery?: Yes  Feeding    LATCH Score                   Interventions    Lactation Tools Discussed/Used Pump Review: Setup, frequency, and cleaning;Milk Storage Initiated by:: Paul Dykes Date initiated:: 07/01/18   Consult Status Consult Status: Follow-up Date: 07/02/18 Follow-up type: In-patient    Little Ishikawa 07/01/2018, 2:48 PM

## 2018-07-01 NOTE — Anesthesia Postprocedure Evaluation (Signed)
Anesthesia Post Note  Patient: Geophysicist/field seismologist  Procedure(s) Performed: CESAREAN SECTION WITH BILATERAL/right  TUBAL LIGATION (N/A )     Patient location during evaluation: Mother Baby Anesthesia Type: Spinal Level of consciousness: oriented and awake and alert Pain management: pain level controlled Vital Signs Assessment: post-procedure vital signs reviewed and stable Respiratory status: spontaneous breathing and respiratory function stable Cardiovascular status: blood pressure returned to baseline and stable Postop Assessment: no headache, no backache, no apparent nausea or vomiting and able to ambulate Anesthetic complications: no    Last Vitals:  Vitals:   07/01/18 1325 07/01/18 1425  BP: 105/75 110/87  Pulse: 70 87  Resp: 18 20  Temp: 36.6 C 36.5 C  SpO2: 98% 98%    Last Pain:  Vitals:   07/01/18 1708  TempSrc:   PainSc: (P) 3    Pain Goal: Patients Stated Pain Goal: (P) 2 (07/01/18 1708)                 Barnet Glasgow

## 2018-07-01 NOTE — Anesthesia Procedure Notes (Signed)
Spinal  Patient location during procedure: OB Start time: 07/01/2018 9:52 AM End time: 07/01/2018 9:57 AM Staffing Anesthesiologist: Barnet Glasgow, MD Performed: anesthesiologist  Preanesthetic Checklist Completed: patient identified, surgical consent, pre-op evaluation, timeout performed, IV checked, risks and benefits discussed and monitors and equipment checked Spinal Block Patient position: sitting Prep: site prepped and draped and DuraPrep Patient monitoring: heart rate, cardiac monitor, continuous pulse ox and blood pressure Approach: midline Location: L3-4 Injection technique: single-shot Needle Needle type: Pencan  Needle gauge: 24 G Needle length: 10 cm Needle insertion depth: 6 cm Assessment Sensory level: T4 Additional Notes 1 Attempt (s). Pt tolerated procedure well.

## 2018-07-01 NOTE — Interval H&P Note (Signed)
History and Physical Interval Note: No change from H/P Reviewed expectations during procedure.  To OR when ready  07/01/2018 9:45 AM  [name]  has presented today for surgery, with the diagnosis of prior cornual pregnancy and wedge resection last pregnancy that needs primary c-section at 38 wks.  The various methods of treatment have been discussed with the patient and family. After consideration of risks, benefits and other options for treatment, the patient has consented to  Procedure(s) with comments: [place] (N/A) - Heather,  RNFA as a surgical intervention.  The patient's history has been reviewed, patient examined, no change in status, stable for surgery.  I have reviewed the patient's chart and labs.  Questions were answered to the patient's satisfaction.     Diamond Cunningham

## 2018-07-01 NOTE — Transfer of Care (Signed)
Immediate Anesthesia Transfer of Care Note  Patient: Diamond Cunningham  Procedure(s) Performed: CESAREAN SECTION WITH BILATERAL/right  TUBAL LIGATION (N/A )  Patient Location: PACU  Anesthesia Type:Spinal  Level of Consciousness: awake  Airway & Oxygen Therapy: Patient Spontanous Breathing  Post-op Assessment: Report given to RN  Post vital signs: Reviewed and stable  Last Vitals:  Vitals Value Taken Time  BP    Temp    Pulse 83 07/01/18 1102  Resp    SpO2 100 % 07/01/18 1102  Vitals shown include unvalidated device data.  Last Pain:  Vitals:   07/01/18 0742  TempSrc: Oral         Complications: No apparent anesthesia complications

## 2018-07-02 ENCOUNTER — Encounter (HOSPITAL_COMMUNITY): Payer: Self-pay | Admitting: Obstetrics and Gynecology

## 2018-07-02 LAB — CBC
HCT: 23.1 % — ABNORMAL LOW (ref 36.0–46.0)
HCT: 26.2 % — ABNORMAL LOW (ref 36.0–46.0)
Hemoglobin: 7.9 g/dL — ABNORMAL LOW (ref 12.0–15.0)
Hemoglobin: 8.8 g/dL — ABNORMAL LOW (ref 12.0–15.0)
MCH: 31.6 pg (ref 26.0–34.0)
MCH: 31 pg (ref 26.0–34.0)
MCHC: 34.2 g/dL (ref 30.0–36.0)
MCHC: 33.6 g/dL (ref 30.0–36.0)
MCV: 92.4 fL (ref 80.0–100.0)
MCV: 92.3 fL (ref 80.0–100.0)
Platelets: 165 10*3/uL (ref 150–400)
Platelets: 208 10*3/uL (ref 150–400)
RBC: 2.5 MIL/uL — ABNORMAL LOW (ref 3.87–5.11)
RBC: 2.84 MIL/uL — ABNORMAL LOW (ref 3.87–5.11)
RDW: 14.6 % (ref 11.5–15.5)
RDW: 14.6 % (ref 11.5–15.5)
WBC: 7.6 10*3/uL (ref 4.0–10.5)
WBC: 8.4 10*3/uL (ref 4.0–10.5)
nRBC: 0 % (ref 0.0–0.2)
nRBC: 0 % (ref 0.0–0.2)

## 2018-07-02 LAB — BIRTH TISSUE RECOVERY COLLECTION (PLACENTA DONATION)

## 2018-07-02 MED ORDER — METFORMIN HCL ER 750 MG PO TB24
2000.0000 mg | ORAL_TABLET | Freq: Every day | ORAL | Status: DC
  Filled 2018-07-02: qty 1

## 2018-07-02 MED ORDER — METFORMIN HCL ER 750 MG PO TB24
1500.0000 mg | ORAL_TABLET | Freq: Every day | ORAL | Status: DC
  Administered 2018-07-02: 1500 mg via ORAL
  Filled 2018-07-02 (×2): qty 2

## 2018-07-02 MED ORDER — ONDANSETRON 4 MG PO TBDP
4.0000 mg | ORAL_TABLET | Freq: Once | ORAL | Status: AC
  Administered 2018-07-02: 4 mg via ORAL
  Filled 2018-07-02: qty 1

## 2018-07-02 NOTE — Plan of Care (Signed)
  Problem: Activity: Goal: Risk for activity intolerance will decrease Note: Discussed the importance of increasing activity today to help with circulation, healing and pain. Maxwell Caul, Leretha Dykes [employer]

## 2018-07-02 NOTE — Lactation Note (Signed)
This note was copied from a baby's chart. Lactation Consultation Note  Patient Name: Girl Aarilyn Dye WVPXT'G Date: 07/02/2018 Reason for consult: Follow-up assessment;Early term 60-38.6wks  P3 mother whose infant is now 34 hours old.  Mother breast fed her other two children (now 75 and 28 years old) for 1 1/2 years each.    Yesterday mother felt as though she may have had a plugged duct in her left breast.  As of this visit, there is no more evidence of this.  Mother pumped and breast fed and this has resolved.  Mother feels like latching is going well and she denies pain with latching.  She is so happy with breast feeding this baby since she encountered many issues with her last child.  Mother will continue to feed 8-12 times/24 hours or sooner if baby shows feeding cues.  Mother is familiar with hand expression and has a colostrum container at bedside for any EBM she obtains with hand expression.    Mother has a DEBP for home use.  Father present and supportive.  Mother encouraged to call for assistance as needed.   Maternal Data Formula Feeding for Exclusion: No Has patient been taught Hand Expression?: Yes Does the patient have breastfeeding experience prior to this delivery?: Yes  Feeding    LATCH Score                   Interventions    Lactation Tools Discussed/Used     Consult Status Consult Status: Follow-up Date: 07/03/18 Follow-up type: In-patient    Krysia Zahradnik R Hakop Humbarger 07/02/2018, 2:52 PM

## 2018-07-02 NOTE — Progress Notes (Signed)
Patient called out from shower asking for assistance.  While in the shower she became shaky and nauseated.  Returned to the bed and vital signs obtained.  Vital signs normal and Dr Melba Coon notified .  Orders received

## 2018-07-02 NOTE — Progress Notes (Addendum)
Subjective: Postpartum Day 1: Cesarean Delivery Patient reports incisional pain, tolerating PO and no problems voiding.  Routine PP care.    Objective: Vital signs in last 24 hours: Temp:  [97.7 F (36.5 C)-98.1 F (36.7 C)] 98.1 F (36.7 C) (06/16 0354) Pulse Rate:  [65-89] 71 (06/16 0354) Resp:  [15-21] 16 (06/16 0354) BP: (102-124)/(46-87) 109/80 (06/16 0354) SpO2:  [95 %-100 %] 95 % (06/16 0354)  Physical Exam:  General: alert and no distress Lochia: appropriate Uterine Fundus: firm Incision: healing well DVT Evaluation: No evidence of DVT seen on physical exam.  Recent Labs    07/01/18 0800 07/02/18 0625  HGB 10.6* 7.9*  HCT 30.9* 23.1*    Assessment/Plan: Status post Cesarean section. Doing well postoperatively.  Continue current care/routine PP care. Wants to restart metformin  2000units ER qhs  Diamond Cunningham 07/02/2018, 8:17 AM

## 2018-07-03 LAB — RPR: RPR Ser Ql: NONREACTIVE

## 2018-07-03 MED ORDER — IBUPROFEN 800 MG PO TABS: 800.0000 mg | ORAL_TABLET | Freq: Three times a day (TID) | ORAL | 0 refills | Status: AC

## 2018-07-03 MED ORDER — OXYCODONE HCL 5 MG PO TABS: 5.0000 mg | ORAL_TABLET | ORAL | 0 refills | Status: AC | PRN

## 2018-07-03 NOTE — Lactation Note (Addendum)
This note was copied from a baby's chart. Lactation Consultation Note: Mother attempting to latch infant when I arrived to the room. Mother in laid back position and placing infant in semi-football position. Mother reports that infant is rolling her bottom lip inward and sometimes she feels pinching.   Mother taught to adjust infants lips for wider gape. Encouraged tucking infants chin deep into the bottom of the breast.  Advised in good breast compression , making sure that infant is not just suckling without swallows.  When breast compression demonstrated, obs infant with audible swallowing.  Mother reports she doesn't do well with hand expression and prefers to pump.  Encouraged mother to continue to post pump and give back any amt of milk that is obtained. Mother reports having 2 ounces of colostrum at home that she pumped before delivery.   Reviewed treatment and prevention of engorgement.  Mother to continue to cue base feed at least 8-12 times in 24 hours. Mother to do frequent STS. Discussed cluster feeding.   Reviewed S/S of Mastitis and advised mother to follow up with [facility] services as needed. Mother receptive to all teaching.    Patient Name: Diamond Cunningham [id number] Date: 07/03/2018 Reason for consult: Follow-up assessment   Maternal Data    Feeding Feeding Type: Breast Fed  LATCH Score Latch: Grasps breast easily, tongue down, lips flanged, rhythmical sucking.  Audible Swallowing: Spontaneous and intermittent  Type of Nipple: Everted at rest and after stimulation  Comfort (Breast/Nipple): Filling, red/small blisters or bruises, mild/mod discomfort  Hold (Positioning): Assistance needed to correctly position infant at breast and maintain latch.(only adjusting infants lower jaw for wider gape.)  LATCH Score: 8  Interventions Interventions: Breast compression;Adjust position;Support pillows;Position options;Expressed milk;DEBP  Lactation Tools Discussed/Used      Consult Status Consult Status: Complete    Darla Lesches 07/03/2018, 11:45 AM

## 2018-07-03 NOTE — Discharge Summary (Signed)
OB Discharge Summary     Patient Name: Diamond Cunningham DOB: 10-27-1990 MRN: [id number]  Date of admission: 07/01/2018 Delivering MD: Carlynn Purl [facility]   Date of discharge: 07/03/2018  Admitting diagnosis: prior cornual pregnancy and wedge resection last pregnancy that needs primary c-section at 36 wks Intrauterine pregnancy: [redacted]w[redacted]d     Secondary diagnosis:  Active Problems:   S/P cesarean section   S/P tubal ligation   Term pregnancy   Postpartum care following cesarean delivery      Discharge diagnosis: Term Pregnancy [facility] course:  Sceduled C/S   28 y.o. yo [id number] at [redacted]w[redacted]d was admitted to the hospital 07/01/2018 for scheduled cesarean section with the following indication:Prior Uterine Surgery.  Membrane Rupture Time/Date: 10:16 AM ,07/01/2018   Patient delivered a Viable infant.07/01/2018  Details of operation can be found in separate operative note.  Pateint had an uncomplicated postpartum course.  She is ambulating, tolerating a regular diet, passing flatus, and urinating well. Patient is discharged home in stable condition on  07/03/18         Physical exam  Vitals:   07/02/18 1350 07/02/18 2033 07/02/18 2152 07/03/18 0539  BP: 109/75 119/80 122/81 129/86  Pulse: 76 81 80 79  Resp: 15 16 18 18   Temp: 98.2 F (36.8 C) 98.2 F (36.8 C) 98.6 F (37 C) 97.6 F (36.4 C)  TempSrc: Oral Oral Oral Oral  SpO2:  98% 99% 100%  Weight:      Height:       General: alert Lochia: appropriate Uterine Fundus: firm Incision: Healing well with no significant drainage  Labs: Lab Results  Component Value Date   WBC 8.4 07/02/2018   HGB 8.8 (L) 07/02/2018   HCT 26.2 (L) 07/02/2018   MCV 92.3 07/02/2018   PLT 208 07/02/2018   No flowsheet data found.  Discharge instruction: per After Visit Summary and "Baby and Me Booklet".  After visit meds:  Allergies as of 07/03/2018      Reactions   Cephalosporins Shortness Of Breath,  Rash   Penicillins Shortness Of Breath, Rash   Did it involve swelling of the face/tongue/throat, SOB, or low BP? Yes Did it involve sudden or severe rash/hives, skin peeling, or any reaction on the inside of your mouth or nose? Yes Did you need to seek medical attention at a hospital or doctor's office? Yes When did it last happen?28-28 years of age If all above answers are "NO", may proceed with cephalosporin use.   Shellfish Allergy Shortness Of Breath, Nausea Only   Phenergan [promethazine Hcl] Other (See Comments)   Muscle pain/anxiousness      Medication List    TAKE these medications   hydrocortisone cream 1 % Apply 1 application topically 2 (two) times daily as needed for itching (bug bites/skin irritation.).   ibuprofen 800 MG tablet Commonly known as: ADVIL Take 1 tablet (800 mg total) by mouth every 8 (eight) hours.   metFORMIN 500 MG 24 hr tablet Commonly known as: GLUCOPHAGE-XR Take 2,000 mg by mouth every evening.   multivitamin-prenatal 27-0.8 MG Tabs tablet Take 1 tablet by mouth every evening.   oxyCODONE 5 MG immediate release tablet Commonly known as: Oxy IR/ROXICODONE Take 1 tablet (5 mg total) by mouth every 4 (four) hours as needed for severe pain.  Diet: routine diet  Activity: Advance as tolerated. Pelvic rest for 6 weeks.   Outpatient follow up:2 weeks  Newborn Data: Live born female  Birth Weight: 7 lb 6.5 oz (3360 g) APGAR: 8, 9  Newborn Delivery   Birth date/time: 07/01/2018 10:17:00 Delivery type: C-Section, Low Transverse Trial of labor: No C-section categorization: Primary      Baby Feeding: Breast Disposition:home with mother   07/03/2018 Zenaida Nieceodd D Tiphani Mells, MD

## 2018-07-03 NOTE — Discharge Instructions (Signed)
As per discharge pamphlet °

## 2018-07-03 NOTE — Progress Notes (Signed)
POD #2 LTCS Doing well, felt bad in shower last pm but has felt fine ambulating since then Afeb, VSS Abd- soft, fundus firm, incision intact Hgb 7.9 to 8.8 Continue routine care, ambulate

## 2018-08-27 ENCOUNTER — Other Ambulatory Visit: Payer: Self-pay | Admitting: Obstetrics and Gynecology

## 2018-08-27 DIAGNOSIS — I2699 Other pulmonary embolism without acute cor pulmonale: Secondary | ICD-10-CM

## 2018-09-04 ENCOUNTER — Encounter: Payer: Self-pay | Admitting: Radiology

## 2018-09-04 ENCOUNTER — Ambulatory Visit
Admission: RE | Admit: 2018-09-04 | Discharge: 2018-09-04 | Disposition: A | Discharge status: Home / Self Care | Payer: Medicaid Other | Source: Ambulatory Visit | Attending: Obstetrics and Gynecology | Admitting: Obstetrics and Gynecology

## 2018-09-04 DIAGNOSIS — I2699 Other pulmonary embolism without acute cor pulmonale: Secondary | ICD-10-CM

## 2018-09-04 MED ORDER — IOPAMIDOL (ISOVUE-370) INJECTION 76%
75.0000 mL | Freq: Once | INTRAVENOUS | Status: AC | PRN
  Administered 2018-09-04: 75 mL via INTRAVENOUS

## 2018-09-24 ENCOUNTER — Other Ambulatory Visit: Payer: Self-pay | Admitting: Registered Nurse

## 2018-09-24 ENCOUNTER — Ambulatory Visit
Admission: RE | Admit: 2018-09-24 | Discharge: 2018-09-24 | Disposition: A | Discharge status: Home / Self Care | Payer: Medicaid Other | Source: Ambulatory Visit | Attending: Registered Nurse | Admitting: Registered Nurse

## 2018-09-24 DIAGNOSIS — J158 Pneumonia due to other specified bacteria: Secondary | ICD-10-CM

## 2018-09-24 DIAGNOSIS — J918 Pleural effusion in other conditions classified elsewhere: Secondary | ICD-10-CM

## 2020-12-07 IMAGING — CT CT ANGIOGRAPHY CHEST
1 of 2 series · 10 of 16 positions shown · IV contrast (iopamidol)
Comparison: None.

CLINICAL DATA: Chest pain and shortness of breath

EXAM:
CT ANGIOGRAPHY CHEST WITH CONTRAST
TECHNIQUE: Multidetector CT imaging of the chest was performed using the
standard protocol during bolus administration of intravenous
contrast. Multiplanar CT image reconstructions and MIPs were
obtained to evaluate the vascular anatomy.
CONTRAST:  75mL 2VOV53-ALQ IOPAMIDOL (2VOV53-ALQ) INJECTION 76%

[Series 6: thins 1.0 b31s · axial · 0.76mm/px · z∈[-254,-15]mm · 10 of 293 slices shown]
[im 27/293  lung]
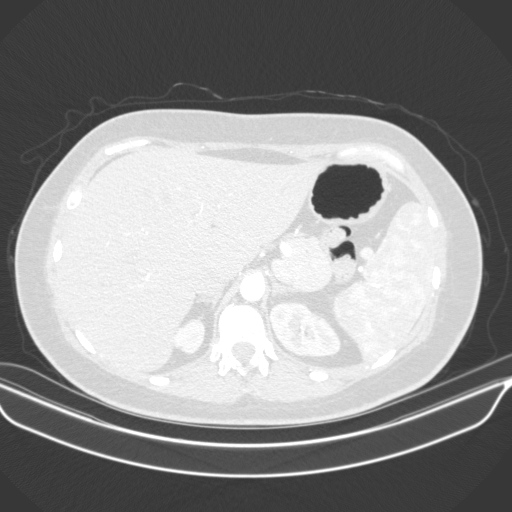
[im 54/293  soft-tissue]
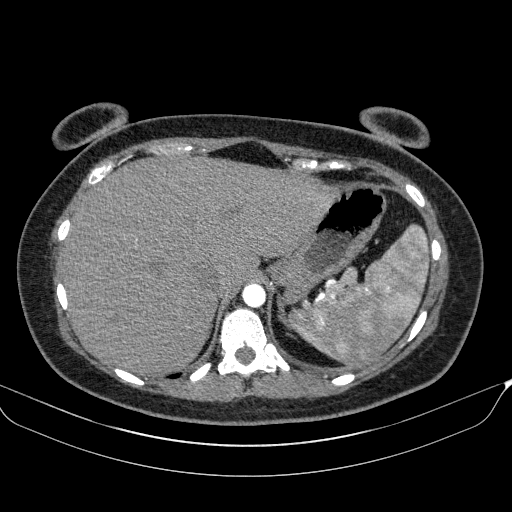
[im 80/293  lung]
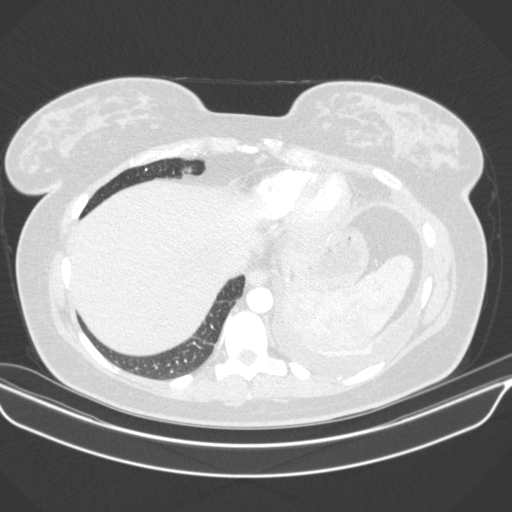
[im 107/293  soft-tissue]
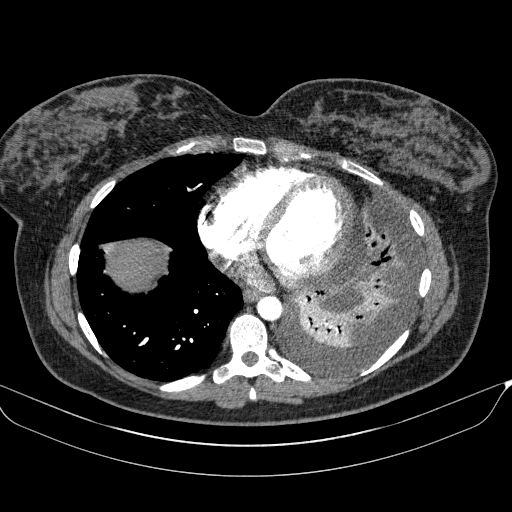
[im 133/293  lung]
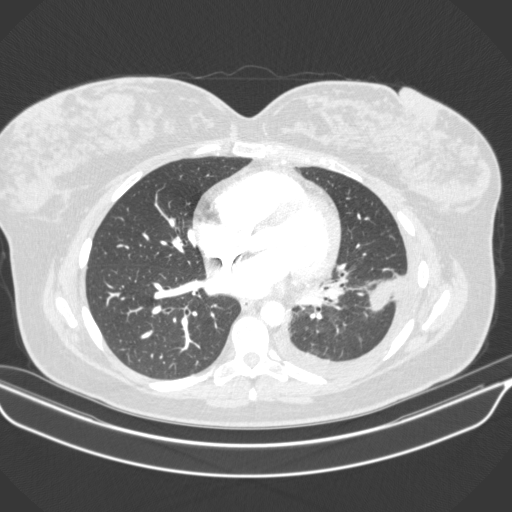
[im 160/293  soft-tissue]
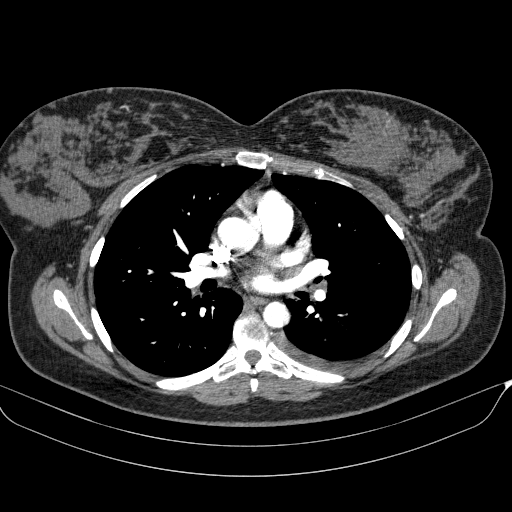
[im 186/293  lung]
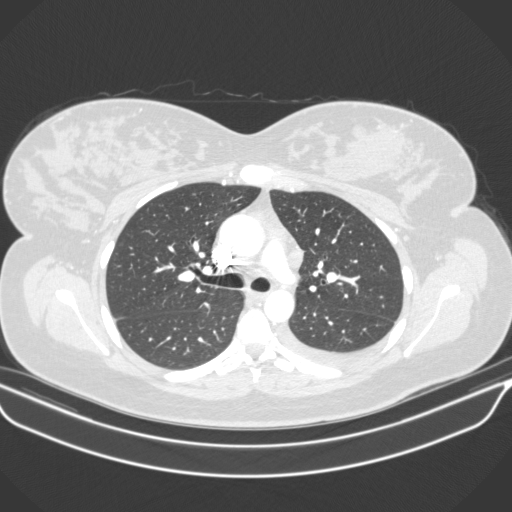
[im 213/293  soft-tissue]
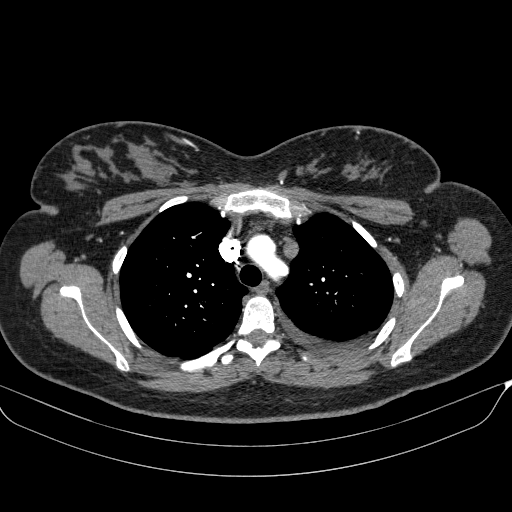
[im 239/293  lung]
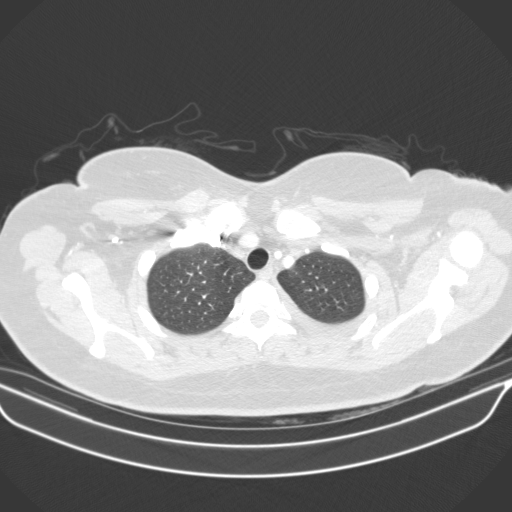
[im 266/293  soft-tissue]
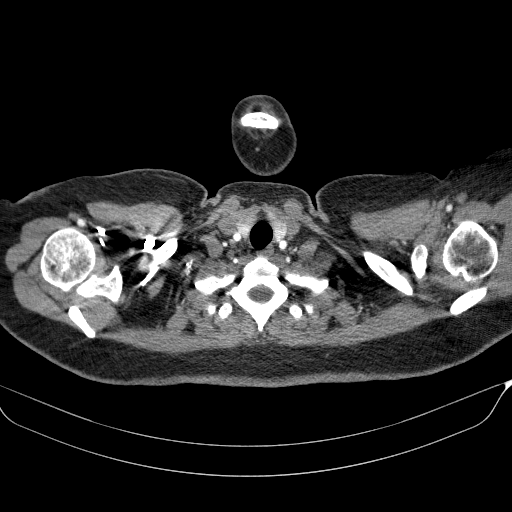

[10 of 16 positions shown; findings below may reference images not displayed]

FINDINGS: Cardiovascular: There is no demonstrable pulmonary embolus. There is
no thoracic aortic aneurysm or dissection. Visualized great vessels
appear unremarkable. There is no pericardial effusion or pericardial
thickening. The main pulmonary outflow tract appears unremarkable.

Mediastinum/Nodes: Thyroid appears unremarkable. There is no
appreciable thoracic adenopathy. A mild amount of residual thymic
tissue is evident, normal for age. No esophageal lesions evident.

Lungs/Pleura: There is a moderate free-flowing pleural effusion on
the left. There is airspace consolidation throughout much of the
left lower lobe. There is patchy atelectatic change in the middle
and lower lobes.

Upper Abdomen: Visualized upper abdominal structures appear
unremarkable.

Musculoskeletal: There are no blastic or lytic bone lesions. No
chest wall lesions are evident.

Review of the MIP images confirms the above findings.
IMPRESSION: 1. No demonstrable pulmonary embolus. No thoracic aortic aneurysm or
dissection.

2. Left lower lobe airspace consolidation consistent with pneumonia.
Areas of right middle and lower lobe atelectasis. Moderate
free-flowing left pleural effusion.

3.  No demonstrable thoracic adenopathy.

## 2020-12-27 IMAGING — DX DG CHEST 2V
2 series · 2 of 2 positions shown · non-contrast
Comparison: CT 09/04/2018

CLINICAL DATA: Pleural effusion and pneumonia

EXAM:
CHEST - 2 VIEW

[dg chest 2 view (1 of 2)]
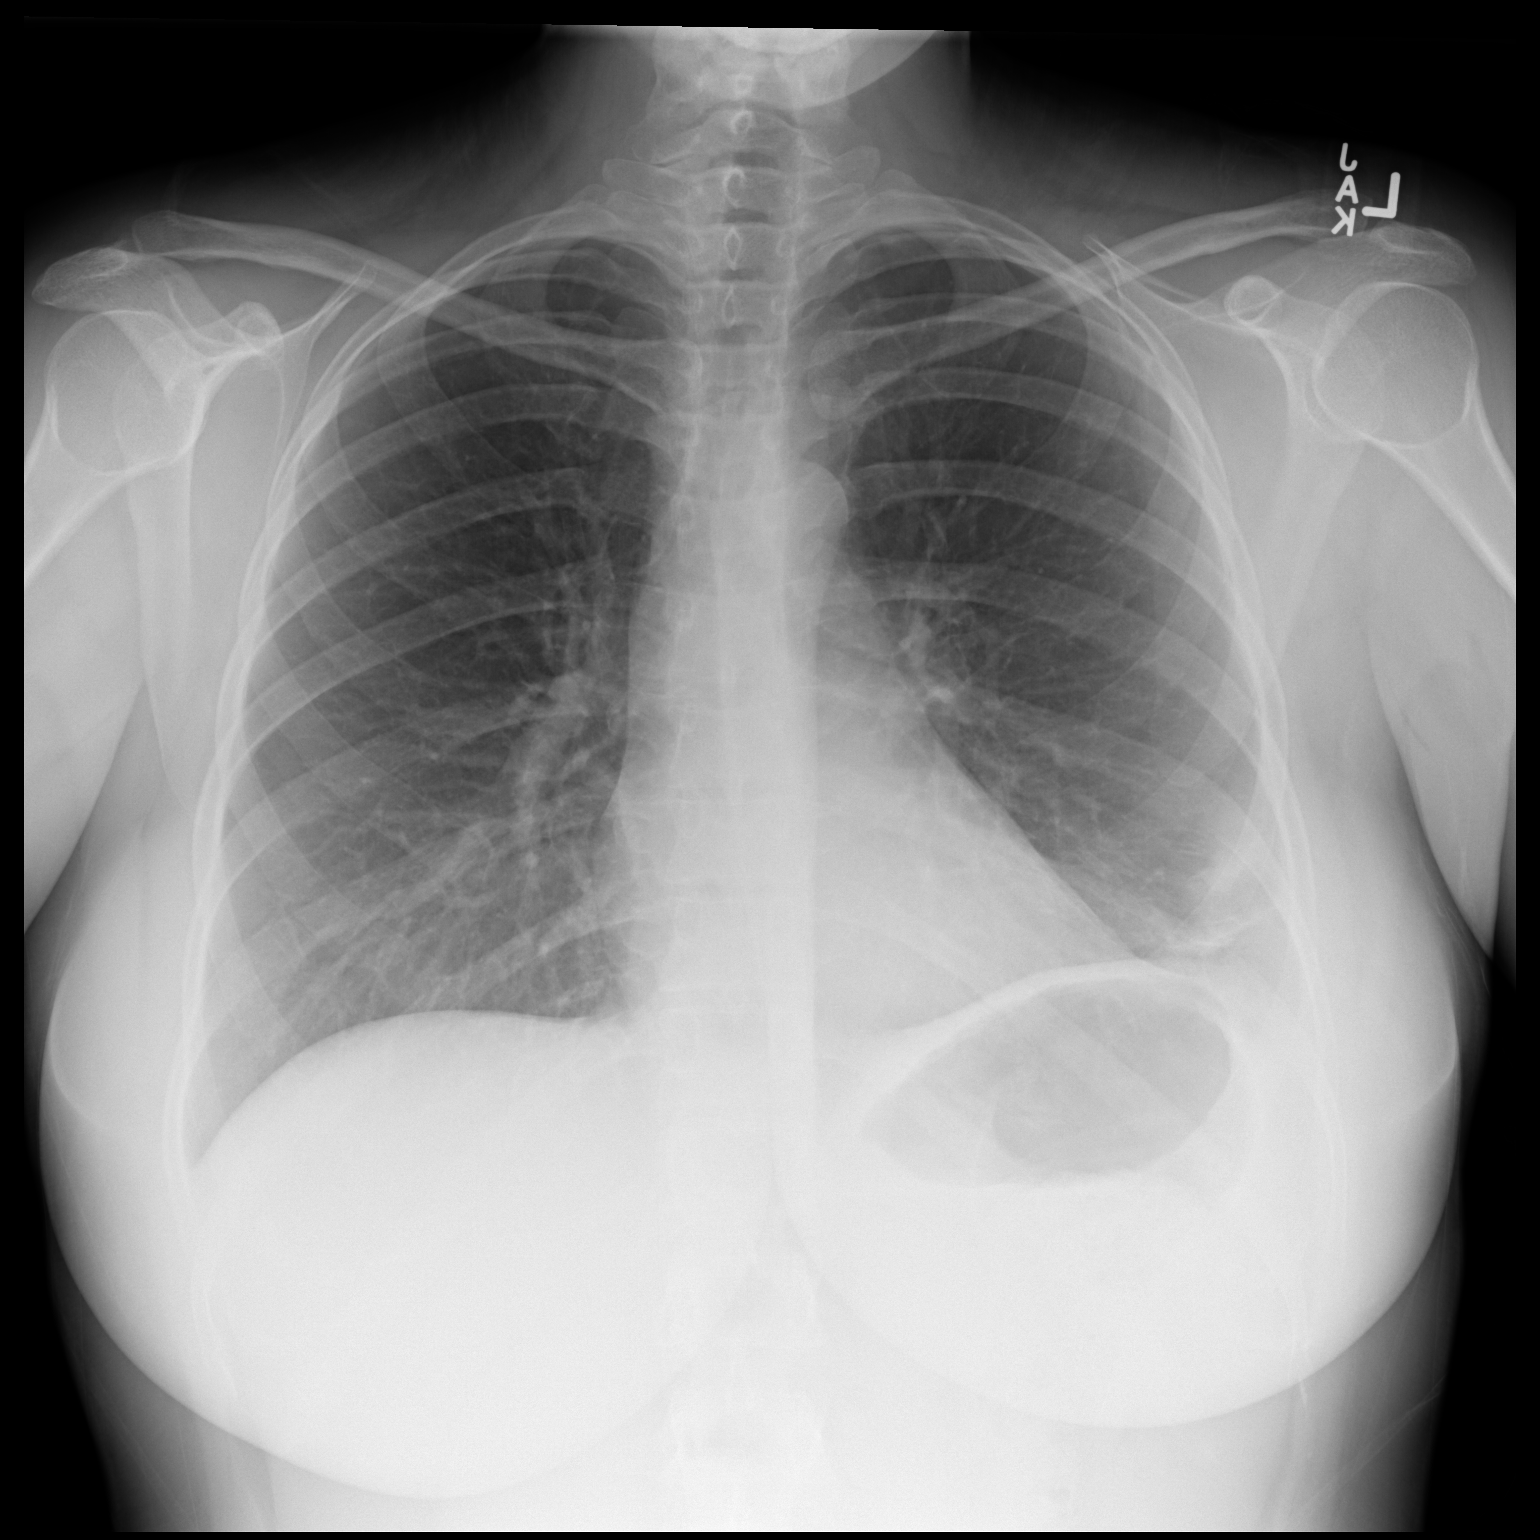

[dg chest 2 view (2 of 2)]
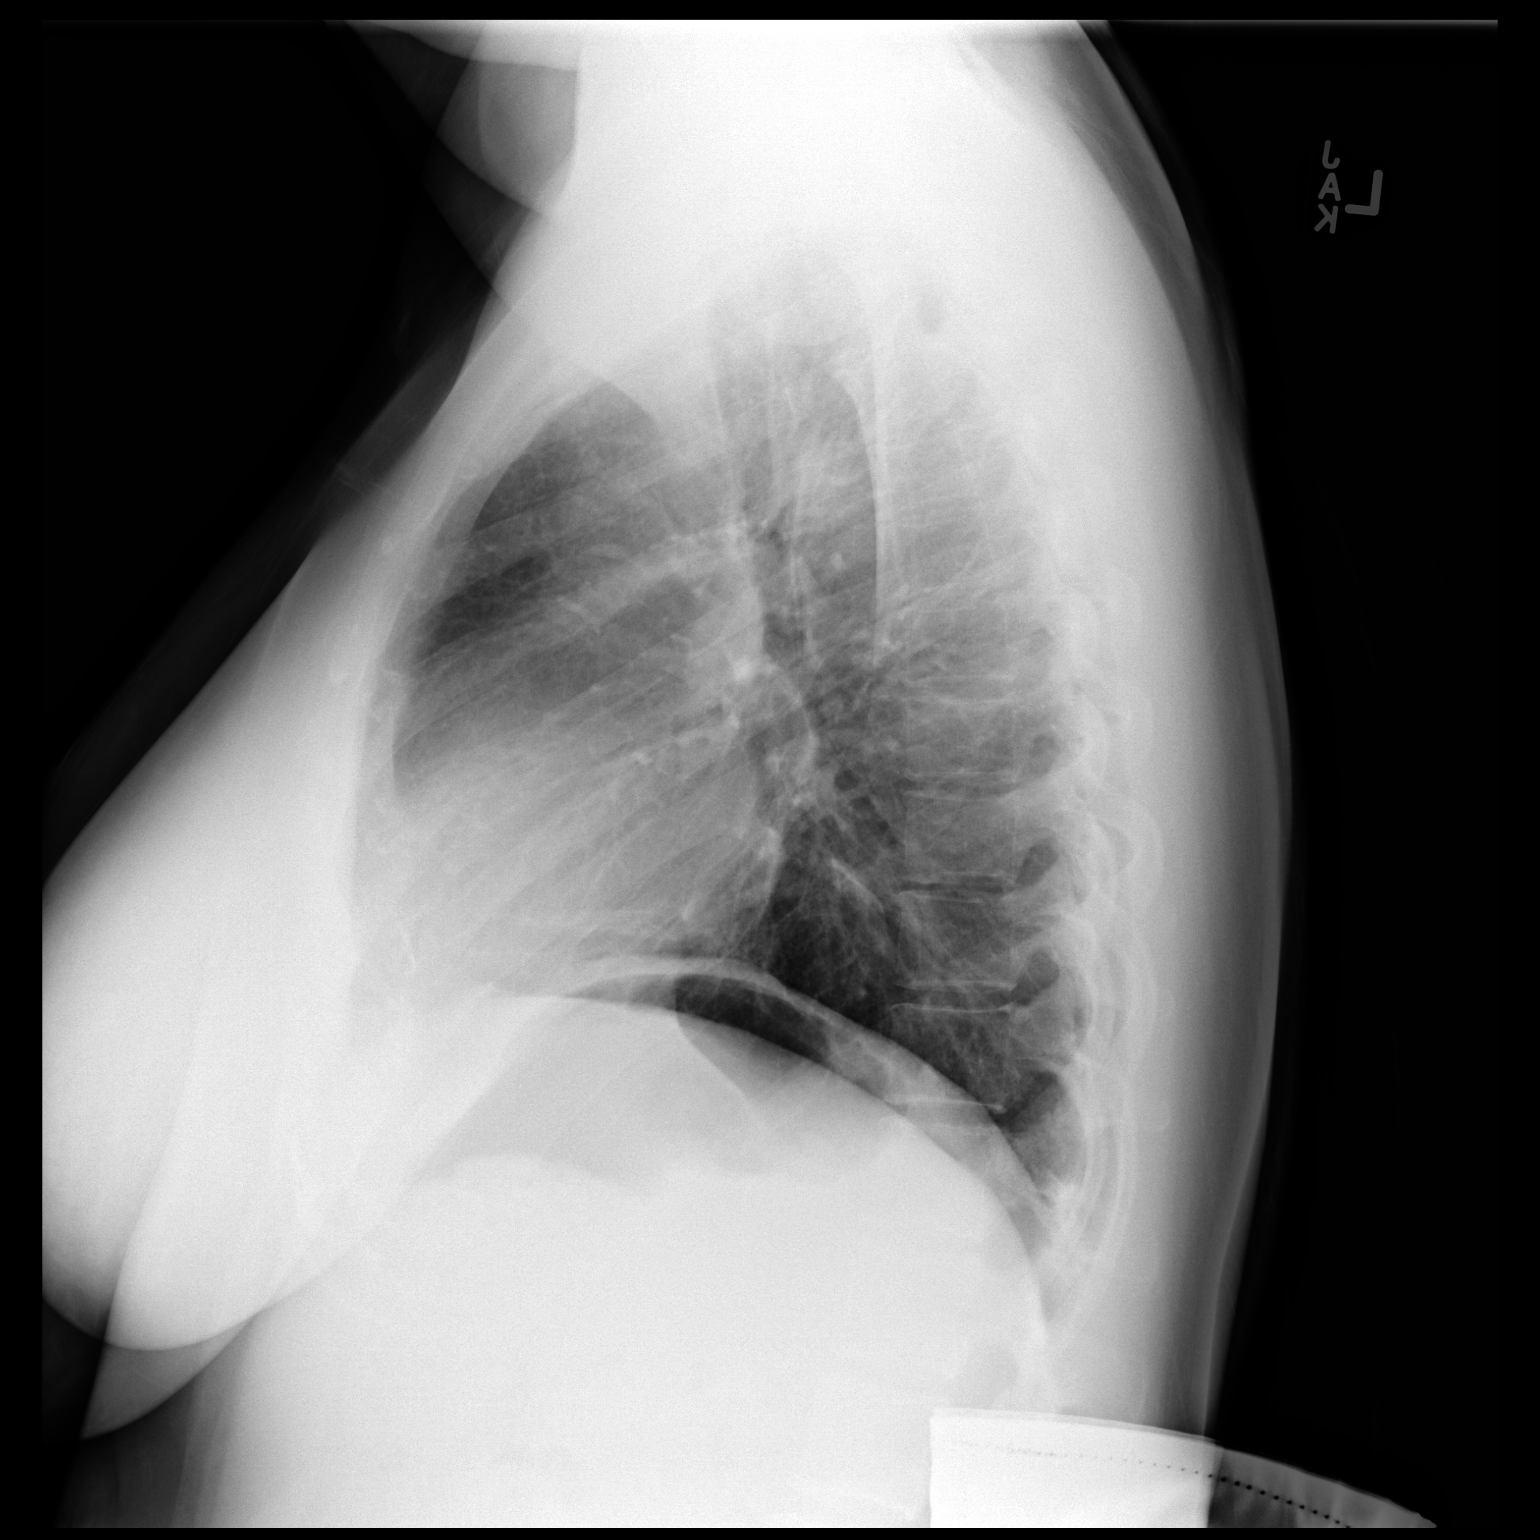

[2 of 2 positions shown; findings below may reference images not displayed]

FINDINGS: Decreased left-sided pleural effusion and improved aeration of left
base. Minimal residual opacity at the left peripheral lung base.
Right lung is clear. Normal heart size. No pneumothorax
IMPRESSION: 1. Decreased left pleural effusion and pneumonia
2. Small residual airspace opacity at the left base could reflect
residual infiltrate versus scarring.
